# Patient Record
Sex: Female | Born: 2000 | Race: Black or African American | Hispanic: No | Marital: Single | Smoking: Never smoker
Health system: Southern US, Community
[De-identification: ages and names within clinical notes are randomized; demographics above are authoritative.]

## PROBLEM LIST (undated history)

## (undated) DIAGNOSIS — Z9109 Other allergy status, other than to drugs and biological substances: Secondary | ICD-10-CM

## (undated) DIAGNOSIS — D573 Sickle-cell trait: Secondary | ICD-10-CM

---
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
## 2024-02-26 ENCOUNTER — Encounter (HOSPITAL_BASED_OUTPATIENT_CLINIC_OR_DEPARTMENT_OTHER): Payer: Self-pay | Admitting: Emergency Medicine

## 2024-02-26 ENCOUNTER — Emergency Department (HOSPITAL_BASED_OUTPATIENT_CLINIC_OR_DEPARTMENT_OTHER)
Admission: EM | Admit: 2024-02-26 | Discharge: 2024-02-27 | Disposition: A | Discharge status: Home / Self Care | Attending: Emergency Medicine | Admitting: Emergency Medicine

## 2024-02-26 ENCOUNTER — Other Ambulatory Visit: Payer: Self-pay

## 2024-02-26 DIAGNOSIS — N12 Tubulo-interstitial nephritis, not specified as acute or chronic: Secondary | ICD-10-CM | POA: Diagnosis not present

## 2024-02-26 DIAGNOSIS — D72829 Elevated white blood cell count, unspecified: Secondary | ICD-10-CM | POA: Diagnosis not present

## 2024-02-26 DIAGNOSIS — R000 Tachycardia, unspecified: Secondary | ICD-10-CM | POA: Insufficient documentation

## 2024-02-26 DIAGNOSIS — E871 Hypo-osmolality and hyponatremia: Secondary | ICD-10-CM | POA: Diagnosis not present

## 2024-02-26 DIAGNOSIS — R112 Nausea with vomiting, unspecified: Secondary | ICD-10-CM | POA: Diagnosis present

## 2024-02-26 LAB — COMPREHENSIVE METABOLIC PANEL WITH GFR
ALT: 57 U/L — ABNORMAL HIGH (ref 0–44)
AST: 43 U/L — ABNORMAL HIGH (ref 15–41)
Albumin: 3.8 g/dL (ref 3.5–5.0)
Alkaline Phosphatase: 62 U/L (ref 38–126)
Anion gap: 14 (ref 5–15)
BUN: 8 mg/dL (ref 6–20)
CO2: 19 mmol/L — ABNORMAL LOW (ref 22–32)
Calcium: 9.1 mg/dL (ref 8.9–10.3)
Chloride: 99 mmol/L (ref 98–111)
Creatinine, Ser: 0.64 mg/dL (ref 0.44–1.00)
GFR, Estimated: >60 mL/min (ref >60)
Glucose, Bld: 80 mg/dL (ref 70–99)
Potassium: 3.7 mmol/L (ref 3.5–5.1)
Sodium: 132 mmol/L — ABNORMAL LOW (ref 135–145)
Total Bilirubin: 0.7 mg/dL (ref 0.0–1.2)
Total Protein: 9.4 g/dL — ABNORMAL HIGH (ref 6.5–8.1)

## 2024-02-26 LAB — LACTIC ACID, PLASMA: Lactic Acid, Venous: 1.3 mmol/L (ref 0.5–1.9)

## 2024-02-26 LAB — URINALYSIS, ROUTINE W REFLEX MICROSCOPIC
Bilirubin Urine: NEGATIVE
Glucose, UA: NEGATIVE mg/dL
Ketones, ur: >=80 mg/dL — AB
Nitrite: POSITIVE — AB
Protein, ur: 30 mg/dL — AB
Specific Gravity, Urine: >=1.03 (ref 1.005–1.030)
pH: 6 (ref 5.0–8.0)

## 2024-02-26 LAB — CBC
HCT: 31.2 % — ABNORMAL LOW (ref 36.0–46.0)
Hemoglobin: 10.6 g/dL — ABNORMAL LOW (ref 12.0–15.0)
MCH: 27.8 pg (ref 26.0–34.0)
MCHC: 34 g/dL (ref 30.0–36.0)
MCV: 81.9 fL (ref 80.0–100.0)
Platelets: 265 K/uL (ref 150–400)
RBC: 3.81 MIL/uL — ABNORMAL LOW (ref 3.87–5.11)
RDW: 12.4 % (ref 11.5–15.5)
WBC: 15.2 K/uL — ABNORMAL HIGH (ref 4.0–10.5)
nRBC: 0 % (ref 0.0–0.2)

## 2024-02-26 LAB — URINALYSIS, MICROSCOPIC (REFLEX)

## 2024-02-26 LAB — RESP PANEL BY RT-PCR (RSV, FLU A&B, COVID)  RVPGX2
Influenza A by PCR: NEGATIVE
Influenza B by PCR: NEGATIVE
Resp Syncytial Virus by PCR: NEGATIVE
SARS Coronavirus 2 by RT PCR: NEGATIVE

## 2024-02-26 LAB — LIPASE, BLOOD: Lipase: 19 U/L (ref 11–51)

## 2024-02-26 LAB — PREGNANCY, URINE: Preg Test, Ur: NEGATIVE

## 2024-02-26 MED ORDER — SODIUM CHLORIDE 0.9 % IV SOLN
1.0000 g | Freq: Once | INTRAVENOUS | Status: AC
  Administered 2024-02-26: 1 g via INTRAVENOUS
  Filled 2024-02-26: qty 10

## 2024-02-26 MED ORDER — ONDANSETRON 4 MG PO TBDP: 4.0000 mg | ORAL_TABLET | Freq: Three times a day (TID) | ORAL | 0 refills | Status: AC | PRN

## 2024-02-26 MED ORDER — ACETAMINOPHEN 325 MG PO TABS
650.0000 mg | ORAL_TABLET | Freq: Once | ORAL | Status: AC
  Administered 2024-02-26: 650 mg via ORAL
  Filled 2024-02-26: qty 2

## 2024-02-26 MED ORDER — SODIUM CHLORIDE 0.9 % IV BOLUS
1000.0000 mL | Freq: Once | INTRAVENOUS | Status: AC
  Administered 2024-02-26: 1000 mL via INTRAVENOUS

## 2024-02-26 MED ORDER — CEPHALEXIN 500 MG PO CAPS: 500.0000 mg | ORAL_CAPSULE | Freq: Four times a day (QID) | ORAL | 0 refills | Status: AC

## 2024-02-26 NOTE — ED Notes (Signed)
 Pt tolerated PO challenge, no nausea or vomiting

## 2024-02-26 NOTE — Discharge Instructions (Addendum)
 Start Keflex, 1 tablet by mouth 4 times daily for 10 days.  Start Zofran and use every 8 hours as needed for nausea.  I have provided you with contact information for  community [facility] and wellness, please contact their office to schedule establish care since you do not have a primary care provider.  Return to the emergency department if your symptoms worsen.

## 2024-02-26 NOTE — ED Triage Notes (Signed)
 Pt reports an episode of vomiting on Tuesday after eating Cava.  Pt has had on and off body aches since then.  Today had 2 episodes of vomiting.  Loss of appetite.

## 2024-02-26 NOTE — ED Provider Notes (Signed)
 [place] [facility] Provider Note   CSN: [id number] Arrival date & time: 02/26/24  1842     Patient presents with: Emesis   Laurie Oconnor is a 23 y.o. female.   23 year old female presenting with nausea/vomiting/chills.  Patient endorses an episode of vomiting Tuesday after eating CAVA, initially she believes that she may have eaten something bad however since that time she has had generalized bodyaches and chills, no known fever at home.  She had 2 additional episodes of vomiting today that did not seem to be triggered by any specific food, she is not nauseated currently.  She denies diarrhea, abdominal pain, back pain, dysuria/hematuria, chest pain, shortness of breath, cough, congestion, pelvic pain, change in vaginal discharge/odor.  She is on her menstrual cycle currently.   Emesis      Prior to Admission medications   Medication Sig Start Date End Date Taking? Authorizing Provider  acetaminophen (TYLENOL) 160 MG chewable tablet Chew 400 mg by mouth every 6 (six) hours as needed for pain.    [provider]  loratadine (CLARITIN) 5 MG chewable tablet Chew 5 mg by mouth daily.    [provider]  montelukast (SINGULAIR) 5 MG chewable tablet Chew 5 mg by mouth at bedtime.    [provider]    Allergies: Patient has no known allergies.    Review of Systems  Gastrointestinal:  Positive for vomiting.    Updated Vital Signs  Vitals:   02/26/24 1848 02/26/24 2252 02/26/24 2253  BP: 127/84  126/75  Pulse: (!) 106 (!) 104 95  Resp: 18  18  Temp: 99.4 F (37.4 C) (!) 100.5 F (38.1 C) (!) 100.5 F (38.1 C)  TempSrc: Oral Oral Oral  SpO2: 100% 100% 100%     Physical Exam Vitals and nursing note reviewed.  Constitutional:      General: She is not in acute distress.    Appearance: She is not ill-appearing.  HENT:     Head: Normocephalic.  Eyes:     Extraocular Movements: Extraocular movements  intact.  Cardiovascular:     Rate and Rhythm: Regular rhythm. Tachycardia present.     Heart sounds: Normal heart sounds.  Pulmonary:     Effort: Pulmonary effort is normal.     Breath sounds: Normal breath sounds.  Abdominal:     Palpations: Abdomen is soft.     Tenderness: There is no abdominal tenderness. There is no right CVA tenderness, left CVA tenderness or guarding.     Comments: (-) Murphy's  Musculoskeletal:     Cervical back: Normal range of motion.     Comments: Moves all extremities spontaneously without difficulty  Skin:    General: Skin is warm and dry.  Neurological:     Mental Status: She is alert and oriented to person, place, and time.     (all labs ordered are listed, but only abnormal results are displayed) Labs Reviewed  COMPREHENSIVE METABOLIC PANEL WITH GFR - Abnormal; Notable for the following components:      Result Value   Sodium 132 (*)    CO2 19 (*)    Total Protein 9.4 (*)    AST 43 (*)    ALT 57 (*)    All other components within normal limits  CBC - Abnormal; Notable for the following components:   WBC 15.2 (*)    RBC 3.81 (*)    Hemoglobin 10.6 (*)    HCT 31.2 (*)  All other components within normal limits  URINALYSIS, ROUTINE W REFLEX MICROSCOPIC - Abnormal; Notable for the following components:   APPearance HAZY (*)    Hgb urine dipstick MODERATE (*)    Ketones, ur >=80 (*)    Protein, ur 30 (*)    Nitrite POSITIVE (*)    Leukocytes,Ua SMALL (*)    All other components within normal limits  URINALYSIS, MICROSCOPIC (REFLEX) - Abnormal; Notable for the following components:   Bacteria, UA MANY (*)    All other components within normal limits  RESP PANEL BY RT-PCR (RSV, FLU A&B, COVID)  [id number]  LIPASE, BLOOD  PREGNANCY, URINE    EKG: None  Radiology: No results found.   Procedures   Medications Ordered in the ED  sodium chloride 0.9 % bolus 1,000 mL (1,000 mLs Intravenous New Bag/Given 02/26/24 2309)  cefTRIAXone  (ROCEPHIN) 1 g in sodium chloride 0.9 % 100 mL IVPB (1 g Intravenous New Bag/Given 02/26/24 2310)  acetaminophen (TYLENOL) tablet 650 mg (650 mg Oral Given 02/26/24 2338)                                    Medical Decision Making This patient presents to the ED for concern of nausea/vomiting/chills, this involves an extensive number of treatment options, and is a complaint that carries with it a high risk of complications and morbidity.  The differential diagnosis includes COVID/flu/RSV, other viral illness, urinary tract infection, pyelonephritis, other intra-abdominal pathology   Lab Tests:  I Ordered, and personally interpreted labs.  The pertinent results include: CBC notable for leukocytosis with white blood cell count of 15.2, hemoglobin of 10.6, unfortunately have no baseline for comparison.  CMP notable for mild hyponatremia with sodium of 132, elevations in AST/ALT at 43 and 57 respectively, I have no baseline for comparison.  Respiratory panel negative.  Urine pregnancy test negative.  Lipase within normal limits at 19.  Urinalysis notable for moderate RBCs with ketones and protein, positive nitrites and leukocytes suggestive of a urinary tract infection with many bacteria on UA, will send for culture.  Lactic normal, 1.3.  Blood culture sent.   Cardiac Monitoring: / EKG:  The patient was maintained on a cardiac monitor.  I personally viewed and interpreted the cardiac monitored which showed an underlying rhythm of: sinus tachycardia   Problem List / ED Course / Critical interventions / Medication management  I ordered medication including IV fluid bolus for rehydration, Tylenol for fever, Rocephin for suspected pyelonephritis  Reevaluation of the patient after these medicines showed that the patient improved I have reviewed the patients home medicines and have made adjustments as needed   Test / Admission - Considered:  Physical exam is largely unremarkable as above,  patient's abdomen is soft and nontender to palpation, no CVA tenderness.  She is well-appearing and in no acute distress. Patient does have borderline tachycardia which is likely a compensatory mechanism given her low-grade fever of 100.5.  She is not hypotensive.   She is found to have a white blood cell count of 15.2, thus meeting SIRS criteria with the findings above.  Lactic is within normal limits, blood cultures obtained. She has some minimal elevation in her AST/ALT, this may be secondary to her episodes of vomiting versus dehydration.  Urinalysis is notable for urinary tract infection, given this with systemic fever/chills her symptoms today are likely secondary to pyelonephritis.  I do not feel  that CT imaging is necessary at this time given reassuring abdominal exam as above. Will give IV Rocephin in the emergency department today for suspected Pyelonephritis. Tachycardia improved after IV fluid bolus. Patient passed p.o. challenge with ease and has not had any recurrent episodes of nausea/vomiting since presenting to the emergency department today.  Will discharge patient with course of antibiotics for suspected pyelonephritis as well as Zofran to be used as needed for nausea.  Strict return precautions discussed, patient voiced understanding and is in agreement this plan.  She is appropriate for discharge at this time.    Amount and/or Complexity of Data Reviewed Labs: ordered.  Risk OTC drugs. Prescription drug management.        Final diagnoses:  Pyelonephritis    ED Discharge Orders          Ordered    cephALEXin (KEFLEX) 500 MG capsule  4 times daily        02/26/24 2353    ondansetron (ZOFRAN-ODT) 4 MG disintegrating tablet  Every 8 hours PRN        02/26/24 2353               Glendia Rocky SAILOR, PA-C 02/26/24 2355    Lenor Hollering, MD 02/27/24 1511

## 2024-03-01 LAB — URINE CULTURE: Culture: >=100000 — AB

## 2024-03-02 ENCOUNTER — Telehealth (HOSPITAL_BASED_OUTPATIENT_CLINIC_OR_DEPARTMENT_OTHER): Payer: Self-pay | Admitting: *Deleted

## 2024-03-02 NOTE — Telephone Encounter (Signed)
 Post ED Visit - Positive Culture Follow-up  Culture report reviewed by antimicrobial stewardship pharmacist: [facility] [x]  Rankin Sams, Pharm.D. []  Venetia Gully, Pharm.D., BCPS AQ-ID []  Garrel Crews, Pharm.D., BCPS []  Almarie Lunger, Pharm.D., BCPS []  [place], [address].D., BCPS, AAHIVP []  Rosaline Bihari, Pharm.D., BCPS, AAHIVP []  Vernell Meier, PharmD, BCPS []  Latanya Hint, PharmD, BCPS []  Donald Medley, PharmD, BCPS []  Rocky Bold, PharmD []  Dorothyann Alert, PharmD, BCPS []  Morene Babe, PharmD  [facility] []  Rosaline Edison, PharmD []  Romona Bliss, PharmD []  Dolphus Roller, PharmD []  Veva Seip, Rph []  Vernell Daunt) Leonce, PharmD []  Eva Allis, PharmD []  Rosaline Millet, PharmD []  Iantha Batch, PharmD []  Arvin Gauss, PharmD []  Wanda Hasting, PharmD []  Ronal Rav, PharmD []  Rocky Slade, PharmD []  Bard Jeans, PharmD   Positive urine culture Treated with cephalexin, organism sensitive to the same and no further patient follow-up is required at this time.  Laurie Oconnor 03/02/2024, 12:12 PM

## 2024-03-03 LAB — CULTURE, BLOOD (ROUTINE X 2)
Culture: NO GROWTH
Culture: NO GROWTH
Special Requests: ADEQUATE
